# Patient Record
Sex: Male | Born: 1955 | Race: White | Hispanic: No | Marital: Married
Health system: Southern US, Community
[De-identification: ages and names within clinical notes are randomized; demographics above are authoritative.]

---

## 2004-06-18 ENCOUNTER — Emergency Department (HOSPITAL_COMMUNITY): Admission: EM | Admit: 2004-06-18 | Discharge: 2004-06-18 | Payer: Self-pay | Admitting: Family Medicine

## 2004-06-19 ENCOUNTER — Emergency Department (HOSPITAL_COMMUNITY): Admission: EM | Admit: 2004-06-19 | Discharge: 2004-06-19 | Payer: Self-pay | Admitting: Family Medicine

## 2004-07-03 ENCOUNTER — Emergency Department (HOSPITAL_COMMUNITY): Admission: EM | Admit: 2004-07-03 | Discharge: 2004-07-03 | Payer: Self-pay | Admitting: Family Medicine

## 2005-11-15 ENCOUNTER — Emergency Department (HOSPITAL_COMMUNITY): Admission: EM | Admit: 2005-11-15 | Discharge: 2005-11-15 | Payer: Self-pay | Admitting: Family Medicine

## 2006-02-11 ENCOUNTER — Emergency Department (HOSPITAL_COMMUNITY): Admission: EM | Admit: 2006-02-11 | Discharge: 2006-02-11 | Payer: Self-pay | Admitting: Emergency Medicine

## 2006-12-11 ENCOUNTER — Emergency Department (HOSPITAL_COMMUNITY): Admission: EM | Admit: 2006-12-11 | Discharge: 2006-12-11 | Payer: Self-pay | Admitting: Family Medicine

## 2018-01-20 ENCOUNTER — Other Ambulatory Visit: Payer: Self-pay | Admitting: Family Medicine

## 2018-01-20 ENCOUNTER — Ambulatory Visit
Admission: RE | Admit: 2018-01-20 | Discharge: 2018-01-20 | Disposition: A | Discharge status: Home / Self Care | Payer: Self-pay | Source: Ambulatory Visit | Attending: Family Medicine | Admitting: Family Medicine

## 2018-01-20 DIAGNOSIS — R059 Cough, unspecified: Secondary | ICD-10-CM

## 2018-01-20 DIAGNOSIS — R05 Cough: Secondary | ICD-10-CM

## 2019-01-08 ENCOUNTER — Other Ambulatory Visit: Payer: Self-pay

## 2019-01-08 ENCOUNTER — Other Ambulatory Visit: Payer: Self-pay | Admitting: Family Medicine

## 2019-01-08 ENCOUNTER — Ambulatory Visit
Admission: RE | Admit: 2019-01-08 | Discharge: 2019-01-08 | Disposition: A | Discharge status: Home / Self Care | Payer: BLUE CROSS/BLUE SHIELD | Source: Ambulatory Visit | Attending: Family Medicine | Admitting: Family Medicine

## 2019-01-08 DIAGNOSIS — M545 Low back pain, unspecified: Secondary | ICD-10-CM

## 2019-01-14 ENCOUNTER — Other Ambulatory Visit: Payer: Self-pay | Admitting: Family Medicine

## 2019-01-14 ENCOUNTER — Other Ambulatory Visit (HOSPITAL_COMMUNITY): Payer: Self-pay | Admitting: Family Medicine

## 2019-01-15 ENCOUNTER — Other Ambulatory Visit: Payer: Self-pay | Admitting: Family Medicine

## 2019-01-15 DIAGNOSIS — M5417 Radiculopathy, lumbosacral region: Secondary | ICD-10-CM

## 2019-01-15 DIAGNOSIS — M5442 Lumbago with sciatica, left side: Secondary | ICD-10-CM

## 2019-01-29 ENCOUNTER — Other Ambulatory Visit: Payer: BLUE CROSS/BLUE SHIELD

## 2020-05-11 ENCOUNTER — Telehealth: Payer: Self-pay | Admitting: Oncology

## 2020-05-11 NOTE — Telephone Encounter (Signed)
Called to Discuss with patient about Covid symptoms and the use of regeneron, a monoclonal antibody infusion for those with mild to moderate Covid symptoms and at a high risk of hospitalization.     Pt is qualified for this infusion at the [facility] infusion center due to co-morbid conditions and/or a member of an at-risk group.    Has PMH HTN  Unable to reach pt. Left VM to return call  Mignon Pine, AGNP-C [phone] [facility])

## 2020-05-12 ENCOUNTER — Other Ambulatory Visit: Payer: Self-pay | Admitting: Oncology

## 2020-05-12 DIAGNOSIS — U071 COVID-19: Secondary | ICD-10-CM

## 2020-05-12 NOTE — Progress Notes (Signed)
I connected by phone with  Mr. Diprima to discuss the potential use of an new treatment for mild to moderate COVID-19 viral infection in non-hospitalized patients.   This patient is a age/sex that meets the FDA criteria for Emergency Use Authorization of casirivimab\imdevimab.  Has a (+) direct SARS-CoV-2 viral test result 1. Has mild or moderate COVID-19  2. Is ? 65 years of age and weighs ? 40 kg 3. Is NOT hospitalized due to COVID-19 4. Is NOT requiring oxygen therapy or requiring an increase in baseline oxygen flow rate due to COVID-19 5. Is within 10 days of symptom onset 6. Has at least one of the high risk factor(s) for progression to severe COVID-19 and/or hospitalization as defined in EUA. ? Specific high risk criteria :.HTN   Symptom onset  05/06/20   I have spoken and communicated the following to the patient or parent/caregiver:   1. FDA has authorized the emergency use of casirivimab\imdevimab for the treatment of mild to moderate COVID-19 in adults and pediatric patients with positive results of direct SARS-CoV-2 viral testing who are 58 years of age and older weighing at least 40 kg, and who are at high risk for progressing to severe COVID-19 and/or hospitalization.   2. The significant known and potential risks and benefits of casirivimab\imdevimab, and the extent to which such potential risks and benefits are unknown.   3. Information on available alternative treatments and the risks and benefits of those alternatives, including clinical trials.   4. Patients treated with casirivimab\imdevimab should continue to self-isolate and use infection control measures (e.g., wear mask, isolate, social distance, avoid sharing personal items, clean and disinfect "high touch" surfaces, and frequent handwashing) according to CDC guidelines.    5. The patient or parent/caregiver has the option to accept or refuse casirivimab\imdevimab .   After reviewing this information with the patient,  The patient agreed to proceed with receiving casirivimab\imdevimab infusion and will be provided a copy of the Fact sheet prior to receiving the infusion.Mignon Pine, AGNP-C [phone] (Infusion Center Hotline)

## 2020-05-13 ENCOUNTER — Ambulatory Visit (HOSPITAL_COMMUNITY)
Admission: RE | Admit: 2020-05-13 | Discharge: 2020-05-13 | Disposition: A | Discharge status: Home / Self Care | Payer: BLUE CROSS/BLUE SHIELD | Source: Ambulatory Visit | Attending: Pulmonary Disease | Admitting: Pulmonary Disease

## 2020-05-13 DIAGNOSIS — U071 COVID-19: Secondary | ICD-10-CM

## 2020-05-13 MED ORDER — METHYLPREDNISOLONE SODIUM SUCC 125 MG IJ SOLR: 125.0000 mg | Freq: Once | INTRAMUSCULAR | Status: DC | PRN

## 2020-05-13 MED ORDER — EPINEPHRINE 0.3 MG/0.3ML IJ SOAJ: 0.3000 mg | Freq: Once | INTRAMUSCULAR | Status: DC | PRN

## 2020-05-13 MED ORDER — SODIUM CHLORIDE 0.9 % IV SOLN: INTRAVENOUS | Status: DC | PRN

## 2020-05-13 MED ORDER — DIPHENHYDRAMINE HCL 50 MG/ML IJ SOLN: 50.0000 mg | Freq: Once | INTRAMUSCULAR | Status: DC | PRN

## 2020-05-13 MED ORDER — FAMOTIDINE IN NACL 20-0.9 MG/50ML-% IV SOLN: 20.0000 mg | Freq: Once | INTRAVENOUS | Status: DC | PRN

## 2020-05-13 MED ORDER — ALBUTEROL SULFATE HFA 108 (90 BASE) MCG/ACT IN AERS: 2.0000 | INHALATION_SPRAY | Freq: Once | RESPIRATORY_TRACT | Status: DC | PRN

## 2020-05-13 MED ORDER — SODIUM CHLORIDE 0.9 % IV SOLN
1200.0000 mg | Freq: Once | INTRAVENOUS | Status: AC
  Administered 2020-05-13: 1200 mg via INTRAVENOUS

## 2020-05-13 NOTE — Progress Notes (Signed)
  Diagnosis: COVID-19  Physician: Wright, MD  Procedure: [facility] Med: casirivimab\imdevimab infusion - Provided patient with casirivimab\imdevimab fact sheet for patients, parents and caregivers prior to infusion.  Complications: No immediate complications noted.  Discharge: Discharged home   Vincent Petersen [name] 05/13/2020   

## 2020-05-13 NOTE — Discharge Instructions (Signed)

## 2020-06-01 IMAGING — CR DG CHEST 2V
2 series · 2 of 2 positions shown · non-contrast
Comparison: November 04, 2007

CLINICAL DATA: Intermittent cough.  Fevers.

EXAM:
CHEST - 2 VIEW

[w chest pa]
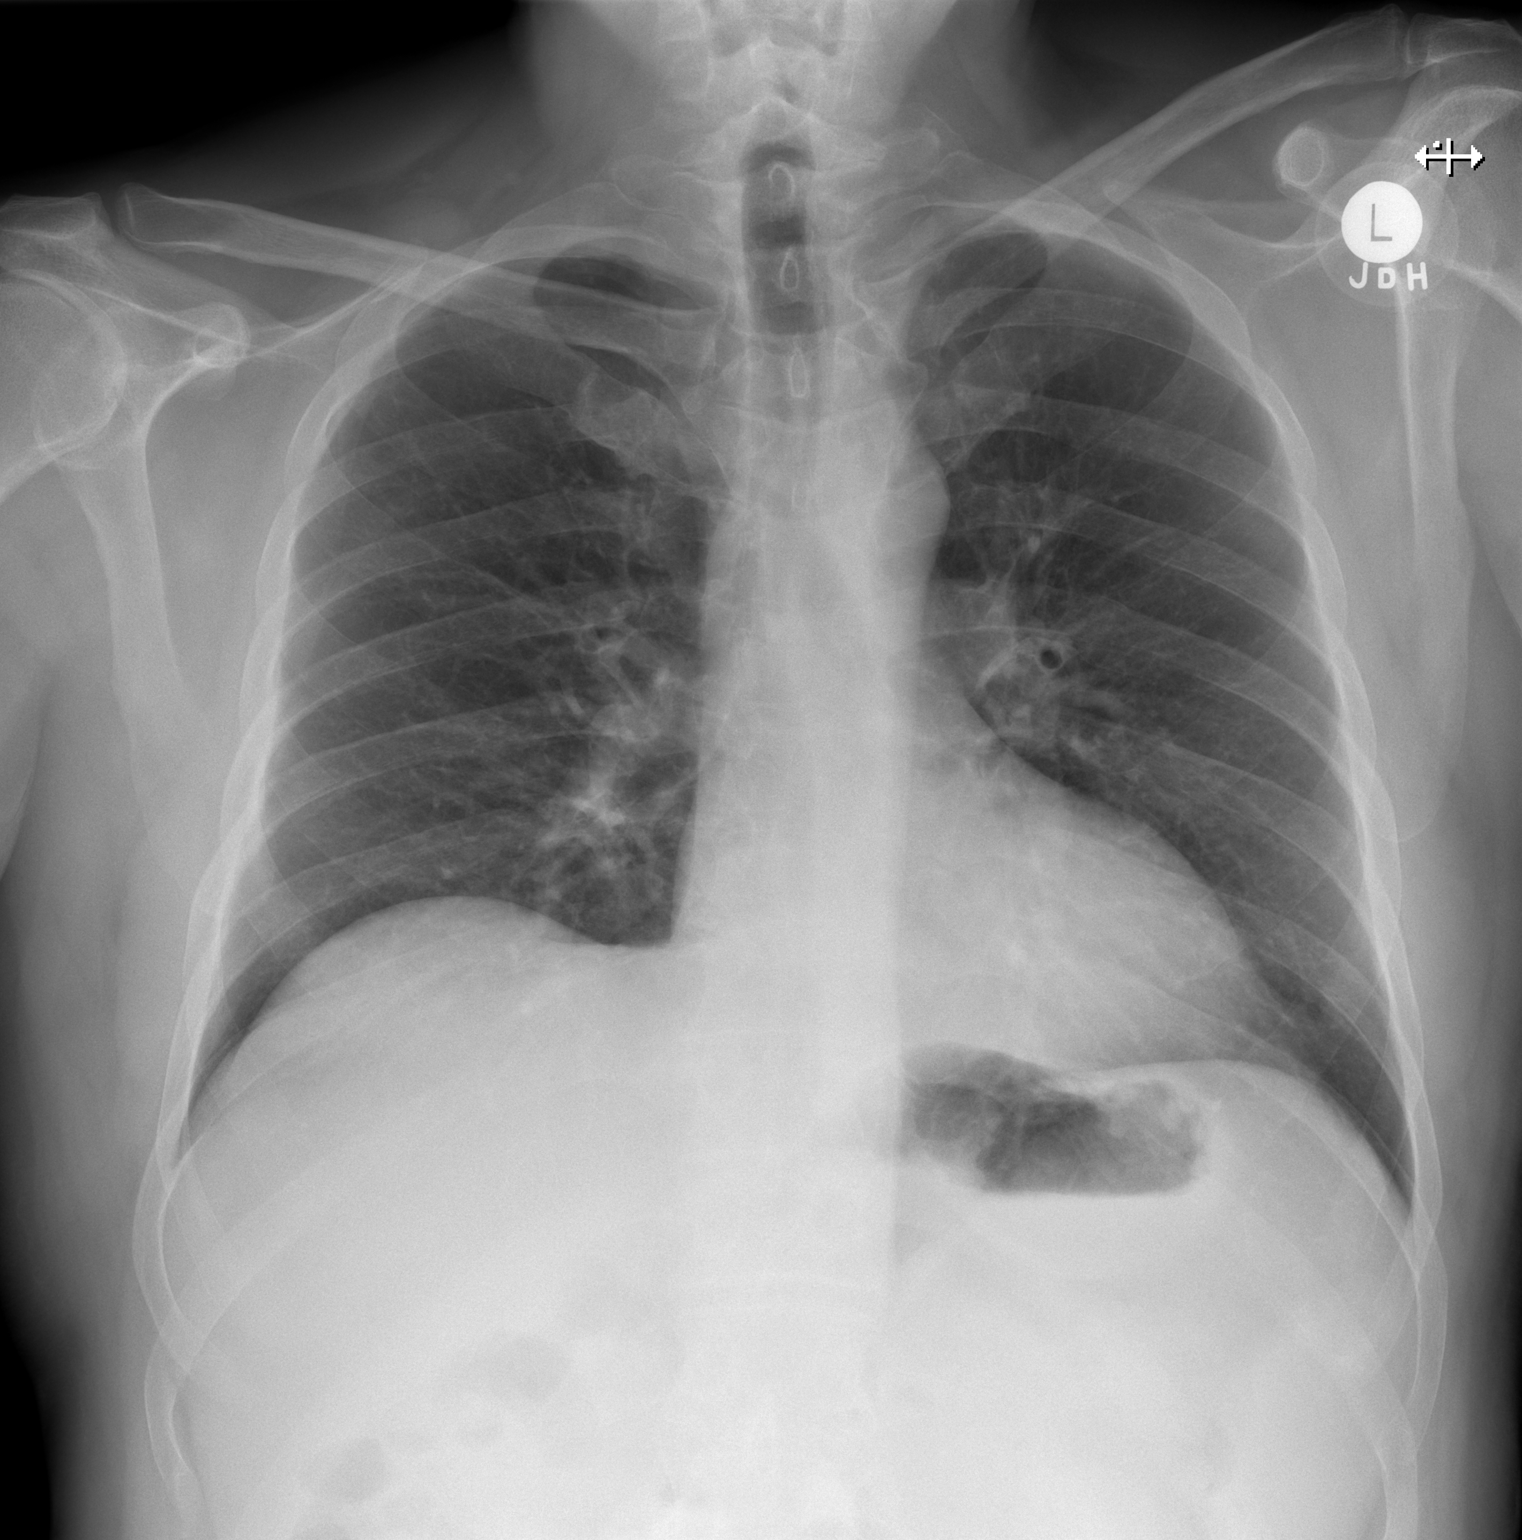

[w chest lat]
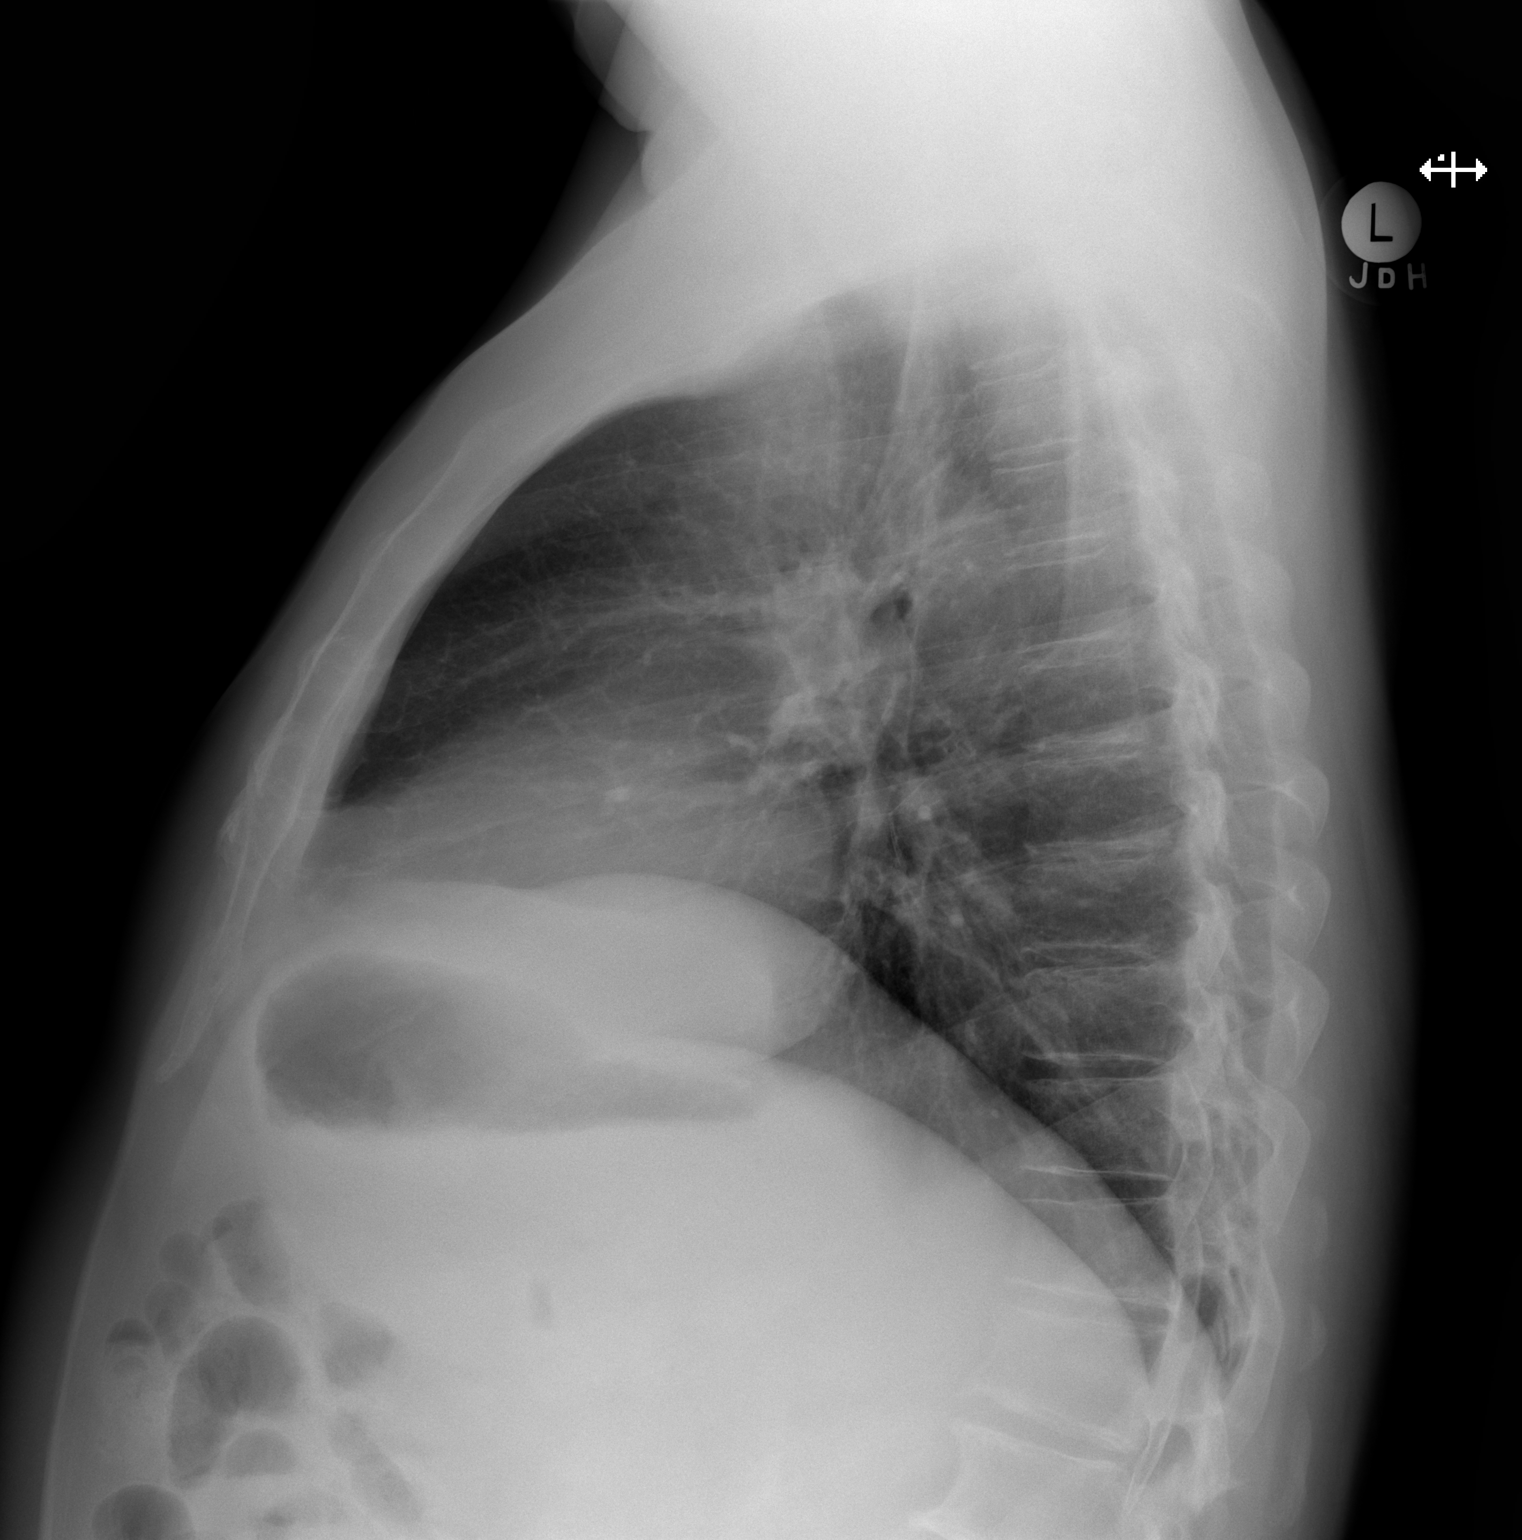

[2 of 2 positions shown; findings below may reference images not displayed]

FINDINGS: The heart size and mediastinal contours are within normal limits.
Both lungs are clear. The visualized skeletal structures are
unremarkable.
IMPRESSION: No active cardiopulmonary disease.

## 2021-05-20 IMAGING — CR LUMBAR SPINE - 2-3 VIEW
3 series · 3 of 3 positions shown · non-contrast
Comparison: None.

CLINICAL DATA: Left-sided back pain for 1 month, no known injury,
initial encounter

EXAM:
LUMBAR SPINE - 2-3 VIEW

[t lumbar spine ap]
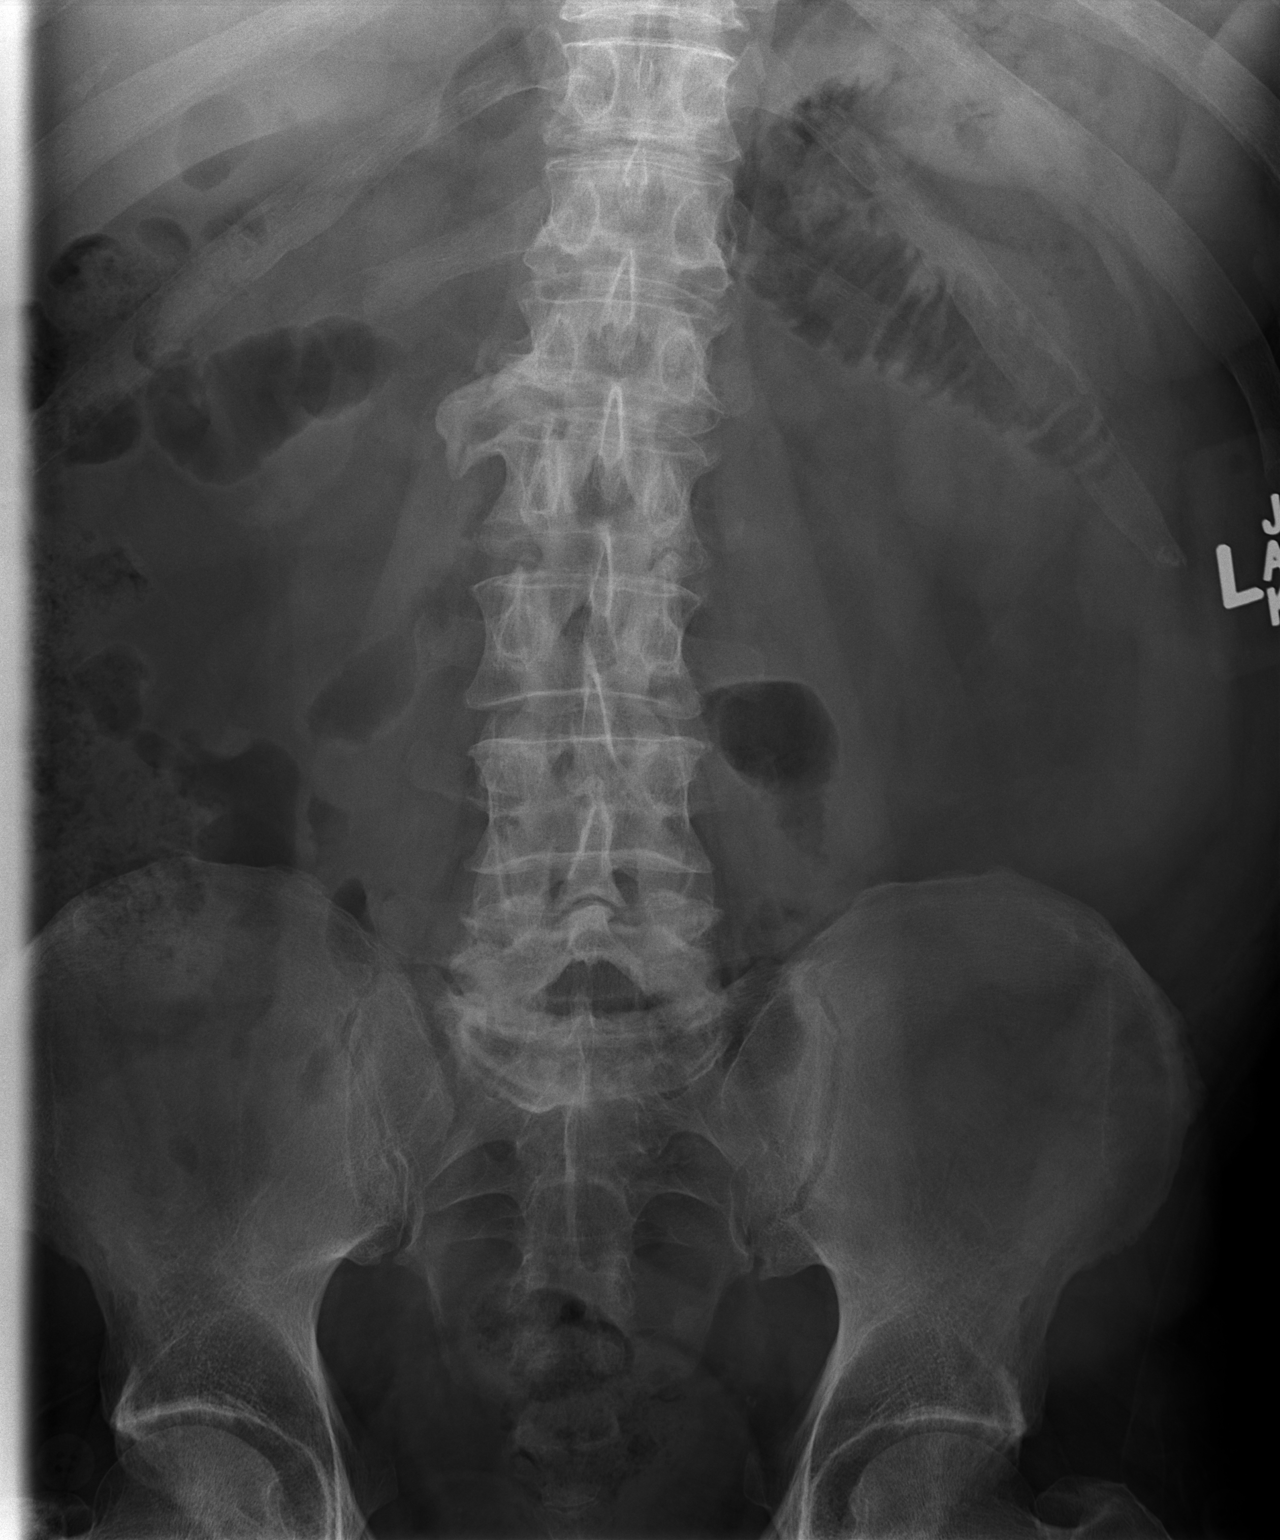

[t lumbar spine lat]
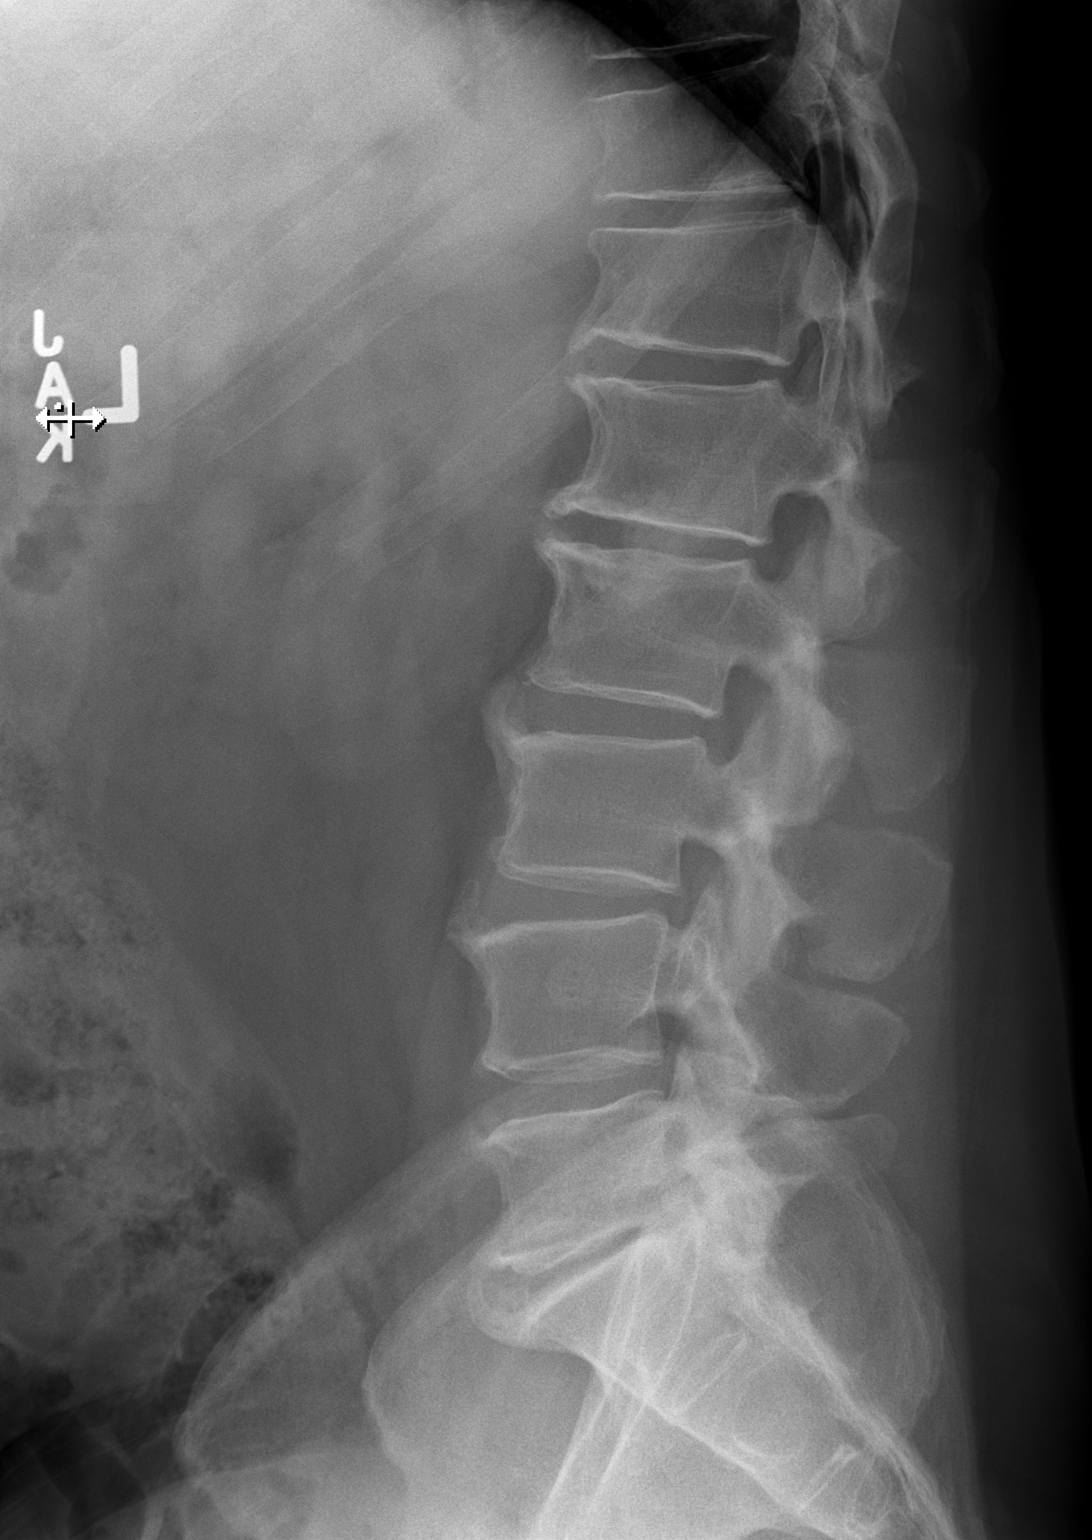

[t lumbar l-5 s-1 spot]
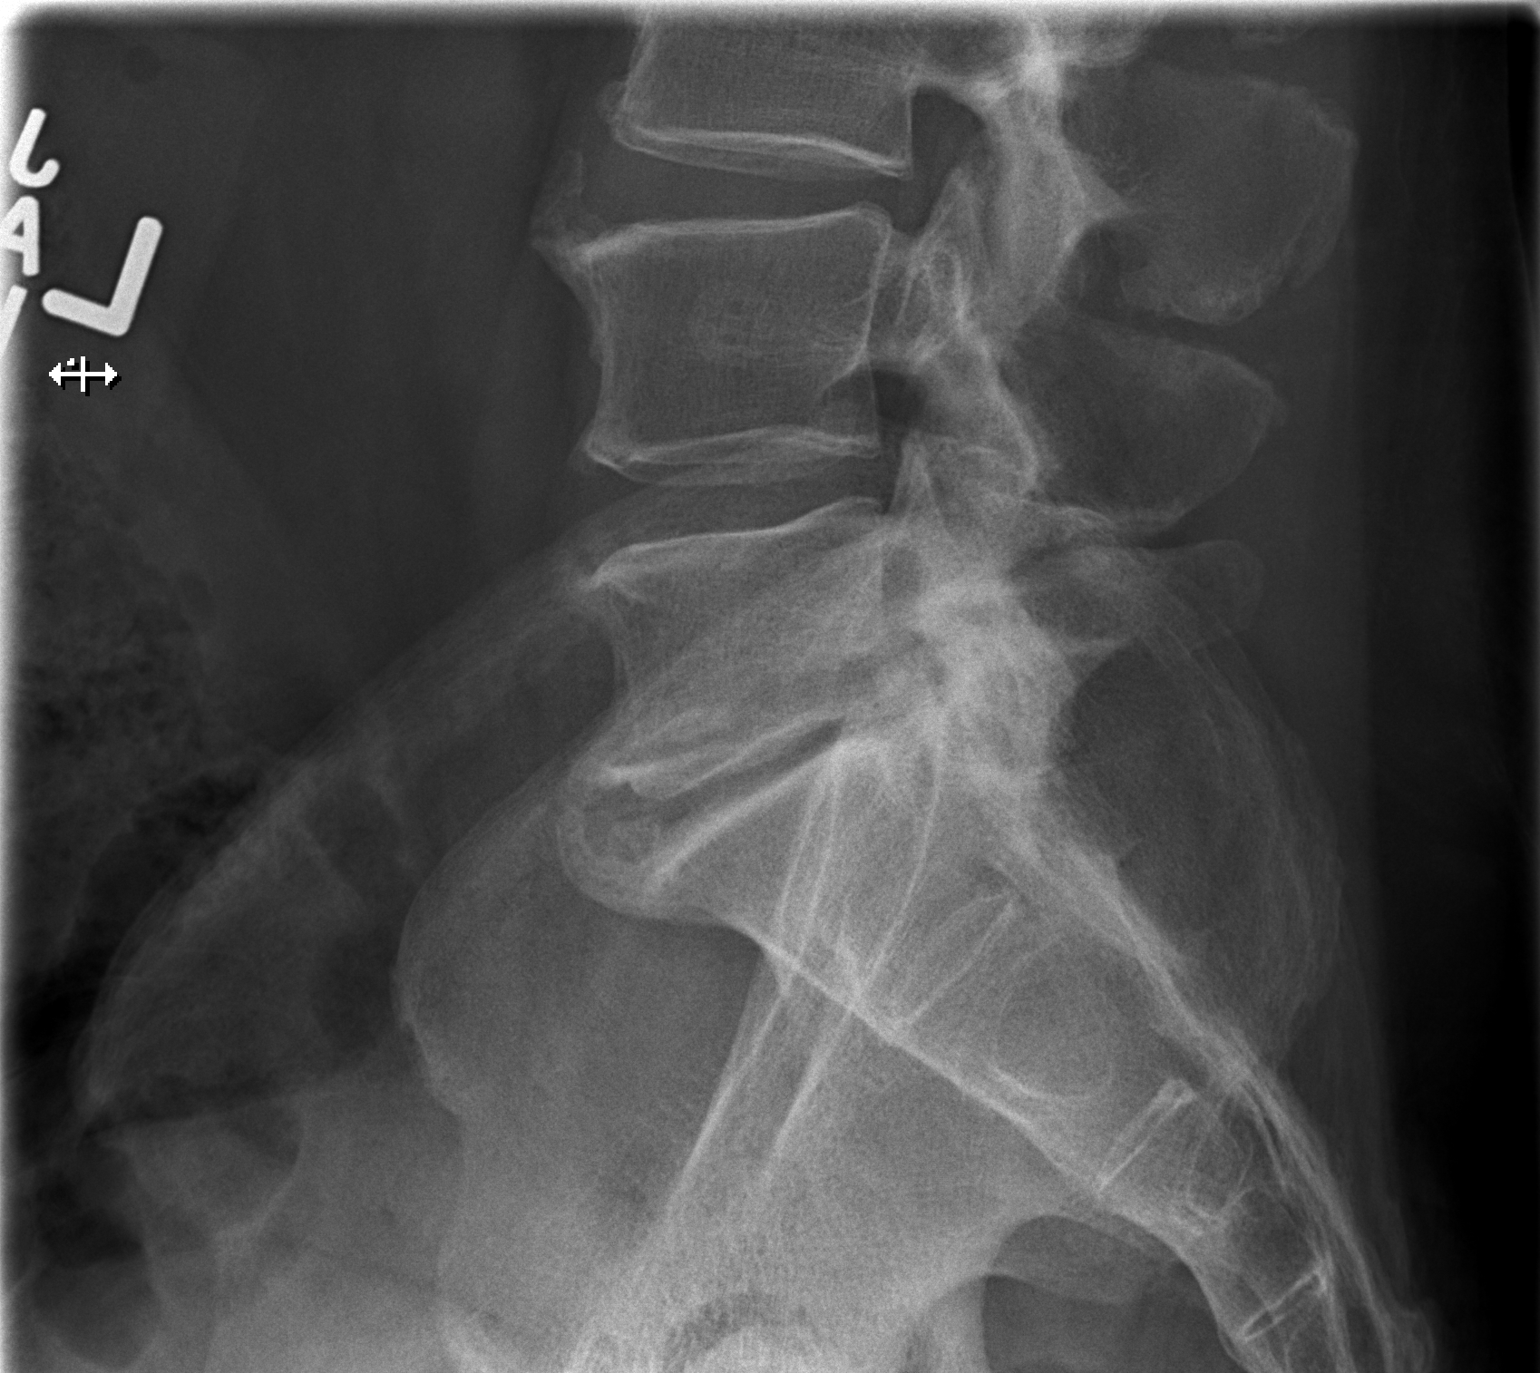

[3 of 3 positions shown; findings below may reference images not displayed]

FINDINGS: Five lumbar type vertebral bodies are well visualized. Vertebral
body height is well maintained. Multilevel osteophytic changes are
seen. No anterolisthesis is noted. Disc space narrowing at L5-S1 is
seen. No soft tissue abnormality is seen.
IMPRESSION: Degenerative change without acute abnormality.
